# Patient Record
Sex: Female | Born: 1970 | Race: Black or African American | Hispanic: No | Marital: Single | Smoking: Never smoker
Health system: Southern US, Community
[De-identification: ages and names within clinical notes are randomized; demographics above are authoritative.]

## PROBLEM LIST (undated history)

## (undated) DIAGNOSIS — R569 Unspecified convulsions: Secondary | ICD-10-CM

## (undated) DIAGNOSIS — R7303 Prediabetes: Secondary | ICD-10-CM

## (undated) DIAGNOSIS — S069X9A Unspecified intracranial injury with loss of consciousness of unspecified duration, initial encounter: Secondary | ICD-10-CM

## (undated) DIAGNOSIS — K5090 Crohn's disease, unspecified, without complications: Secondary | ICD-10-CM

## (undated) DIAGNOSIS — S069XAA Unspecified intracranial injury with loss of consciousness status unknown, initial encounter: Secondary | ICD-10-CM

## (undated) HISTORY — PX: BACK SURGERY: SHX140

## (undated) HISTORY — PX: BOWEL RESECTION: SHX1257

## (undated) HISTORY — PX: ABDOMINAL HYSTERECTOMY: SHX81

## (undated) HISTORY — PX: ABDOMINAL SURGERY: SHX537

---

## 2017-05-13 ENCOUNTER — Emergency Department (HOSPITAL_COMMUNITY): Payer: BLUE CROSS/BLUE SHIELD

## 2017-05-13 ENCOUNTER — Emergency Department (HOSPITAL_COMMUNITY)
Admission: EM | Admit: 2017-05-13 | Discharge: 2017-05-13 | Disposition: A | Discharge status: Home / Self Care | Payer: BLUE CROSS/BLUE SHIELD | Attending: Emergency Medicine | Admitting: Emergency Medicine

## 2017-05-13 ENCOUNTER — Encounter (HOSPITAL_COMMUNITY): Payer: Self-pay | Admitting: Emergency Medicine

## 2017-05-13 DIAGNOSIS — Z79899 Other long term (current) drug therapy: Secondary | ICD-10-CM | POA: Diagnosis not present

## 2017-05-13 DIAGNOSIS — R1084 Generalized abdominal pain: Secondary | ICD-10-CM

## 2017-05-13 DIAGNOSIS — Z7951 Long term (current) use of inhaled steroids: Secondary | ICD-10-CM | POA: Diagnosis not present

## 2017-05-13 DIAGNOSIS — N390 Urinary tract infection, site not specified: Secondary | ICD-10-CM

## 2017-05-13 DIAGNOSIS — R109 Unspecified abdominal pain: Secondary | ICD-10-CM | POA: Diagnosis present

## 2017-05-13 HISTORY — DX: Unspecified intracranial injury with loss of consciousness status unknown, initial encounter: S06.9XAA

## 2017-05-13 HISTORY — DX: Unspecified intracranial injury with loss of consciousness of unspecified duration, initial encounter: S06.9X9A

## 2017-05-13 HISTORY — DX: Unspecified convulsions: R56.9

## 2017-05-13 HISTORY — DX: Prediabetes: R73.03

## 2017-05-13 HISTORY — DX: Crohn's disease, unspecified, without complications: K50.90

## 2017-05-13 LAB — URINALYSIS, ROUTINE W REFLEX MICROSCOPIC
Bilirubin Urine: NEGATIVE
Glucose, UA: NEGATIVE mg/dL
Hgb urine dipstick: NEGATIVE
Ketones, ur: NEGATIVE mg/dL
NITRITE: NEGATIVE
Protein, ur: NEGATIVE mg/dL
SPECIFIC GRAVITY, URINE: 1.025 (ref 1.005–1.030)
pH: 5 (ref 5.0–8.0)

## 2017-05-13 LAB — COMPREHENSIVE METABOLIC PANEL
ALBUMIN: 5 g/dL (ref 3.5–5.0)
ALT: 18 U/L (ref 14–54)
AST: 26 U/L (ref 15–41)
Alkaline Phosphatase: 82 U/L (ref 38–126)
Anion gap: 9 (ref 5–15)
BILIRUBIN TOTAL: 0.7 mg/dL (ref 0.3–1.2)
BUN: 13 mg/dL (ref 6–20)
CO2: 27 mmol/L (ref 22–32)
CREATININE: 1.11 mg/dL — AB (ref 0.44–1.00)
Calcium: 9.5 mg/dL (ref 8.9–10.3)
Chloride: 107 mmol/L (ref 101–111)
GFR calc Af Amer: >60 mL/min (ref >60)
GFR calc non Af Amer: 59 mL/min — ABNORMAL LOW (ref >60)
GLUCOSE: 83 mg/dL (ref 65–99)
POTASSIUM: 3 mmol/L — AB (ref 3.5–5.1)
Sodium: 143 mmol/L (ref 135–145)
TOTAL PROTEIN: 8.6 g/dL — AB (ref 6.5–8.1)

## 2017-05-13 LAB — CBC
HCT: 41.1 % (ref 36.0–46.0)
Hemoglobin: 14 g/dL (ref 12.0–15.0)
MCH: 29.8 pg (ref 26.0–34.0)
MCHC: 34.1 g/dL (ref 30.0–36.0)
MCV: 87.4 fL (ref 78.0–100.0)
PLATELETS: 403 10*3/uL — AB (ref 150–400)
RBC: 4.7 MIL/uL (ref 3.87–5.11)
RDW: 14.6 % (ref 11.5–15.5)
WBC: 7.8 10*3/uL (ref 4.0–10.5)

## 2017-05-13 LAB — LIPASE, BLOOD: Lipase: 21 U/L (ref 11–51)

## 2017-05-13 MED ORDER — FENTANYL CITRATE (PF) 100 MCG/2ML IJ SOLN
100.0000 ug | INTRAMUSCULAR | Status: DC | PRN
  Administered 2017-05-13 (×2): 100 ug via INTRAVENOUS
  Filled 2017-05-13 (×2): qty 2

## 2017-05-13 MED ORDER — OXYCODONE-ACETAMINOPHEN 5-325 MG PO TABS: 1.0000 | ORAL_TABLET | ORAL | 0 refills | Status: AC | PRN

## 2017-05-13 MED ORDER — IOPAMIDOL (ISOVUE-300) INJECTION 61%
INTRAVENOUS | Status: AC
  Filled 2017-05-13: qty 100

## 2017-05-13 MED ORDER — THIAMINE HCL 100 MG/ML IJ SOLN
Freq: Once | INTRAVENOUS | Status: AC
  Administered 2017-05-13: 06:00:00 via INTRAVENOUS
  Filled 2017-05-13: qty 1000

## 2017-05-13 MED ORDER — CEPHALEXIN 500 MG PO CAPS: 500.0000 mg | ORAL_CAPSULE | Freq: Four times a day (QID) | ORAL | 0 refills | Status: AC

## 2017-05-13 MED ORDER — SODIUM CHLORIDE 0.9 % IV BOLUS (SEPSIS)
1000.0000 mL | Freq: Once | INTRAVENOUS | Status: AC
  Administered 2017-05-13: 1000 mL via INTRAVENOUS

## 2017-05-13 MED ORDER — ONDANSETRON HCL 4 MG/2ML IJ SOLN
4.0000 mg | Freq: Once | INTRAMUSCULAR | Status: AC
  Administered 2017-05-13: 4 mg via INTRAVENOUS
  Filled 2017-05-13: qty 2

## 2017-05-13 MED ORDER — IOPAMIDOL (ISOVUE-300) INJECTION 61%
INTRAVENOUS | Status: AC
  Filled 2017-05-13: qty 30

## 2017-05-13 MED ORDER — IOPAMIDOL (ISOVUE-300) INJECTION 61%
15.0000 mL | Freq: Once | INTRAVENOUS | Status: AC | PRN
  Administered 2017-05-13: 15 mL via ORAL

## 2017-05-13 MED ORDER — IOPAMIDOL (ISOVUE-300) INJECTION 61%
100.0000 mL | Freq: Once | INTRAVENOUS | Status: AC | PRN
  Administered 2017-05-13: 100 mL via INTRAVENOUS

## 2017-05-13 MED ORDER — SODIUM CHLORIDE 0.9 % IV SOLN
INTRAVENOUS | Status: DC
  Administered 2017-05-13: 06:00:00 via INTRAVENOUS

## 2017-05-13 MED ORDER — POTASSIUM CHLORIDE CRYS ER 20 MEQ PO TBCR
40.0000 meq | EXTENDED_RELEASE_TABLET | Freq: Once | ORAL | Status: AC
  Administered 2017-05-13: 40 meq via ORAL
  Filled 2017-05-13: qty 2

## 2017-05-13 NOTE — ED Triage Notes (Signed)
Pt states she has crohn's disease and is having a flare up  Pt states she is from out of town here visiting her sister and for the past 4 days she has not been able to hold anything down  Pt states she vomits 3 times a day  Pt states she feels dehydrated and is weak  Pt states she has only voided once today  Pt states she is not able to have a BM but is passing mucous and blood from her rectum

## 2017-05-13 NOTE — Discharge Instructions (Signed)
Get plenty of rest, and drink a lot of fluids.  Follow-up with the doctor of your choice if not better in 3 or 4 days.

## 2017-05-13 NOTE — ED Provider Notes (Signed)
[facility]-EMERGENCY DEPT Provider Note   CSN: [id number] Arrival date & time: 05/13/17  0421     History   Chief Complaint Chief Complaint  Patient presents with  . Abdominal Pain    HPI Laura Pratt is a 46 y.o. female.  She presents for evaluation of abdominal pain, vomiting, fever, chills, shortness of breath, cough, paresthesias and generalized weakness.  She states that she has not had a bowel movement in several days.  Also, she is seeing blood, in the emesis, and when she attempted to have a bowel movement, passed "mucus with blood."  Symptom onset several days ago.  She feels like this is a exacerbation of her Crohn's disease.  She has been taking her usual medications, without relief.  She is visiting [place] currently.  She does not have a local medical doctor.  There are no other known modifying factors.    HPI  Past Medical History:  Diagnosis Date  . Crohn disease (HCC)   . Prediabetes   . Seizures (HCC)   . TBI (traumatic brain injury) (HCC)     There are no active problems to display for this patient.   Past Surgical History:  Procedure Laterality Date  . ABDOMINAL HYSTERECTOMY    . ABDOMINAL SURGERY    . BACK SURGERY    . BOWEL RESECTION      OB History    No data available       Home Medications    Prior to Admission medications   Medication Sig Start Date End Date Taking? Authorizing Provider  acyclovir (ZOVIRAX) 400 MG tablet Take 400 mg by mouth daily.   Yes [provider]  albuterol (VENTOLIN HFA) 108 (90 Base) MCG/ACT inhaler Inhale 2 puffs into the lungs every 4 (four) hours.   Yes [provider]  ALPRAZolam Prudy Feeler) 1 MG tablet Take 1 mg by mouth 2 (two) times daily as needed for anxiety. 05/05/17  Yes [provider]  amphetamine-dextroamphetamine (ADDERALL XR) 30 MG 24 hr capsule Take 30 mg by mouth 2 (two) times daily. 05/09/17  Yes [provider]  calcium citrate-vitamin D (CITRACAL+D) 315-200  MG-UNIT tablet Take 1 tablet by mouth daily.   Yes [provider]  cyanocobalamin (,VITAMIN B-12,) 1000 MCG/ML injection Inject 1,000 mcg into the skin every 21 ( twenty-one) days. 05/09/17  Yes [provider]  ergocalciferol (VITAMIN D2) 50000 units capsule Take 50,000 mg by mouth every 7 (seven) days.   Yes [provider]  escitalopram (LEXAPRO) 20 MG tablet Take 20 mg by mouth daily. 04/19/17  Yes [provider]  gabapentin (NEURONTIN) 300 MG capsule Take 300 mg by mouth daily. 04/22/16  Yes [provider]  hydrOXYzine (ATARAX/VISTARIL) 25 MG tablet Take 25 mg by mouth daily. 03/01/17  Yes [provider]  lamoTRIgine (LAMICTAL) 200 MG tablet Take 200 mg by mouth daily. 02/15/17  Yes [provider]  linaclotide (LINZESS) 145 MCG CAPS capsule Take 145 mcg by mouth daily.   Yes [provider]  lisinopril (PRINIVIL,ZESTRIL) 5 MG tablet Take 5 mg by mouth daily.   Yes [provider]  metFORMIN (GLUCOPHAGE-XR) 500 MG 24 hr tablet Take 500 mg by mouth 2 (two) times daily.    Yes [provider]  Multiple Vitamins-Minerals (MULTIVITAMIN ADULT PO) Take 1 tablet by mouth daily.   Yes [provider]  nitrofurantoin (MACRODANTIN) 100 MG capsule Take 100 mg by mouth every 12 (twelve) hours. 05/11/17 05/17/17 Yes [provider]  pantoprazole (PROTONIX) 40 MG tablet Take 40 mg by mouth daily.   Yes [provider]  promethazine (PHENERGAN) 25 MG tablet Take 25 mg by mouth every 12 (twelve) hours as needed for nausea/vomiting.   Yes [provider]  topiramate (TOPAMAX) 200 MG tablet Take 200 mg by mouth 2 (two) times daily. 04/24/17  Yes [provider]  ziprasidone (GEODON) 40 MG capsule Take 40 mg by mouth daily. 05/04/17  Yes [provider]  cephALEXin (KEFLEX) 500 MG capsule Take 1 capsule (500 mg total) by mouth 4 (four) times daily. 05/13/17   Mancel BaleWentz, Kaydan Wong, MD    oxyCODONE-acetaminophen (PERCOCET) 5-325 MG tablet Take 1 tablet by mouth every 4 (four) hours as needed for severe pain. 05/13/17   Mancel BaleWentz, Hargun Spurling, MD    Family History Family History  Problem Relation Age of Onset  . Hypertension Other   . Diabetes Other   . Stroke Other   . Seizures Other     Social History Social History  Substance Use Topics  . Smoking status: Never Smoker  . Smokeless tobacco: Never Used  . Alcohol use No     Allergies   Hydrocodone; Latex; and Naproxen   Review of Systems Review of Systems  All other systems reviewed and are negative.    Physical Exam Updated Vital Signs BP 120/78   Pulse 85   Temp 98.2 F (36.8 C) (Oral)   Resp 16   SpO2 100%   Physical Exam  Constitutional: She is oriented to person, place, and time. She appears well-developed and well-nourished. She appears distressed (She is uncomfortable).  HENT:  Head: Normocephalic and atraumatic.  Eyes: Conjunctivae and EOM are normal. Pupils are equal, round, and reactive to light.  Neck: Normal range of motion and phonation normal. Neck supple.  Cardiovascular: Normal rate and regular rhythm.   Pulmonary/Chest: Effort normal and breath sounds normal. She exhibits no tenderness.  Abdominal: Soft. She exhibits no distension and no mass. There is tenderness (Diffuse, moderate.). There is no rebound and no guarding.  Musculoskeletal: Normal range of motion.  Neurological: She is alert and oriented to person, place, and time. She exhibits normal muscle tone.  Skin: Skin is warm and dry.  Psychiatric: She has a normal mood and affect. Her behavior is normal. Judgment and thought content normal.  Nursing note and vitals reviewed.    ED Treatments / Results  Labs (all labs ordered are listed, but only abnormal results are displayed) Labs Reviewed  COMPREHENSIVE METABOLIC PANEL - Abnormal; Notable for the following:       Result Value   Potassium 3.0 (*)    Creatinine, Ser 1.11  (*)    Total Protein 8.6 (*)    GFR calc non Af Amer 59 (*)    All other components within normal limits  CBC - Abnormal; Notable for the following:    Platelets 403 (*)    All other components within normal limits  URINALYSIS, ROUTINE W REFLEX MICROSCOPIC - Abnormal; Notable for the following:    Color, Urine AMBER (*)    APPearance HAZY (*)    Leukocytes, UA MODERATE (*)    Bacteria, UA RARE (*)    Squamous Epithelial / LPF 0-5 (*)    All other components within normal limits  URINE CULTURE  LIPASE, BLOOD    EKG  EKG Interpretation None       Radiology Ct Abdomen Pelvis W Contrast  Result Date: 05/13/2017 CLINICAL DATA:  46 year old female with  abdominal and pelvic pain with vomiting for 4 days. History of Crohn's disease. EXAM: CT ABDOMEN AND PELVIS WITH CONTRAST TECHNIQUE: Multidetector CT imaging of the abdomen and pelvis was performed using the standard protocol following bolus administration of intravenous contrast. CONTRAST:  ISOVUE-300 IOPAMIDOL (ISOVUE-300) INJECTION 61% COMPARISON:  None. FINDINGS: Lower chest: No acute abnormality Hepatobiliary: The liver and gallbladder are unremarkable. There is no evidence of biliary dilatation. Pancreas: Unremarkable Spleen: Unremarkable Adrenals/Urinary Tract: A punctate nonobstructing left lower pole renal calculus and small mid right renal cyst are present. The kidneys, adrenal glands and bladder are otherwise unremarkable. Stomach/Bowel: Surgical changes along the stomach noted. There is no evidence of bowel obstruction, bowel wall thickening or inflammatory changes. The appendix is normal. Vascular/Lymphatic: Aortic atherosclerotic calcifications noted without aneurysm. No enlarged lymph nodes. Reproductive: Status post hysterectomy. No adnexal masses. Other: A trace amount of free pelvic fluid may be physiologic. No focal collection or pneumoperitoneum. Musculoskeletal: No acute or suspicious abnormality. Posterior fusion in the  lower lumbar spine noted. IMPRESSION: Trace amount of free fluid which may be physiologic. No other acute abnormalities noted. No evidence of bowel obstruction, bowel wall thickening or inflammatory bowel changes. Punctate nonobstructing left renal calculus. Abdominal aortic atherosclerosis. Electronically Signed   By: Harmon Pier M.D.   On: 05/13/2017 08:06    Procedures Procedures (including critical care time)  Medications Ordered in ED Medications  0.9 %  sodium chloride infusion ( Intravenous New Bag/Given 05/13/17 0622)  fentaNYL (SUBLIMAZE) injection 100 mcg (100 mcg Intravenous Given 05/13/17 0627)  iopamidol (ISOVUE-300) 61 % injection (not administered)  iopamidol (ISOVUE-300) 61 % injection (not administered)  sodium chloride 0.9 % bolus 1,000 mL (0 mLs Intravenous Stopped 05/13/17 0845)  ondansetron (ZOFRAN) injection 4 mg (4 mg Intravenous Given 05/13/17 0622)  sodium chloride 0.9 % 1,000 mL with thiamine 100 mg, folic acid 1 mg, multivitamins adult 10 mL infusion ( Intravenous New Bag/Given 05/13/17 0622)  iopamidol (ISOVUE-300) 61 % injection 15 mL (15 mLs Oral Contrast Given 05/13/17 0628)  iopamidol (ISOVUE-300) 61 % injection 100 mL (100 mLs Intravenous Contrast Given 05/13/17 0730)  potassium chloride SA (K-DUR,KLOR-CON) CR tablet 40 mEq (40 mEq Oral Given 05/13/17 0848)     Initial Impression / Assessment and Plan / ED Course  I have reviewed the triage vital signs and the nursing notes.  Pertinent labs & imaging results that were available during my care of the patient were reviewed by me and considered in my medical decision making (see chart for details).      Patient Vitals for the past 24 hrs:  BP Temp Temp src Pulse Resp SpO2  05/13/17 0850 120/78 - - 85 16 100 %  05/13/17 0611 126/82 - - 83 18 96 %  05/13/17 0439 135/89 98.2 F (36.8 C) Oral 89 16 100 %    9:14 AM Reevaluation with update and discussion. After initial assessment and treatment, an updated evaluation  reveals patient appears comfortable.  Findings discussed with the patient.  To complete the rest of her "banana bag.",  Prior to leaving.  All questions answered.Mancel Bale L    Final Clinical Impressions(s) / ED Diagnoses   Final diagnoses:  Generalized abdominal pain  Urinary tract infection without hematuria, site unspecified    Nonspecific abdominal pain with reported Crohn's disease.  The patient's CT does not indicate inflammatory bowel disease or any other acute process.  Possible urinary tract infection, culture ordered.  There is no indication for hospitalization at this  time.  I doubt serious bacterial infection, metabolic instability or impending vascular collapse.  Nursing Notes Reviewed/ Care Coordinated Applicable Imaging Reviewed Interpretation of Laboratory Data incorporated into ED treatment  The patient appears reasonably screened and/or stabilized for discharge and I doubt any other medical condition or other [facility] requiring further screening, evaluation, or treatment in the ED at this time prior to discharge.  Plan: Home Medications-continue usual home medications, except Macrodantin; Home Treatments-rest, gradually advance diet; return here if the recommended treatment, does not improve the symptoms; Recommended follow up-PCP, as needed  New Prescriptions New Prescriptions   CEPHALEXIN (KEFLEX) 500 MG CAPSULE    Take 1 capsule (500 mg total) by mouth 4 (four) times daily.   OXYCODONE-ACETAMINOPHEN (PERCOCET) 5-325 MG TABLET    Take 1 tablet by mouth every 4 (four) hours as needed for severe pain.     Mancel Bale, MD 05/13/17 [phone]

## 2017-05-14 LAB — URINE CULTURE: Culture: NO GROWTH

## 2018-10-22 IMAGING — CT CT ABD-PELV W/ CM
2 of 5 series · 16 of 46 positions shown, 18 images · IV contrast (iopamidol)
Comparison: None.

CLINICAL DATA: 46-year-old female with abdominal and pelvic pain
with vomiting for 4 days. History of Crohn's disease.

EXAM:
CT ABDOMEN AND PELVIS WITH CONTRAST
TECHNIQUE: Multidetector CT imaging of the abdomen and pelvis was performed
using the standard protocol following bolus administration of
intravenous contrast.
CONTRAST:  100mL [id number] IOPAMIDOL ([id number]-[id number]) INJECTION 61%

[Series 2: abd/pel with · axial · 0.74mm/px · z∈[-491,-81]mm · 13 of 94 slices shown, 15 images]
[im 6/94  soft-tissue]
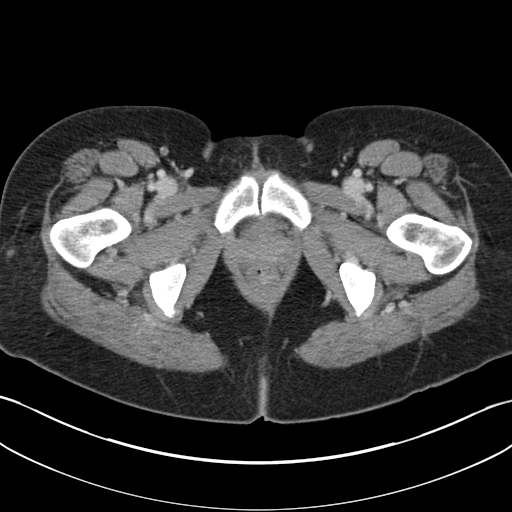
[im 6/94  bone]
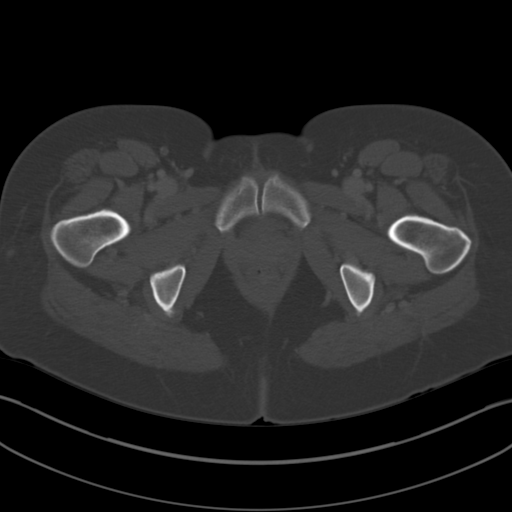
[im 12/94  soft-tissue]
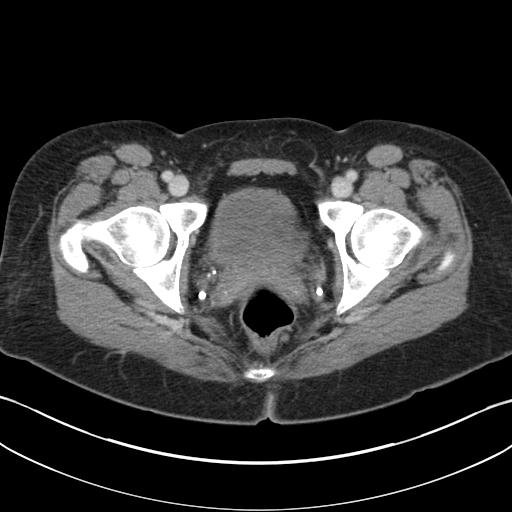
[im 18/94  soft-tissue]
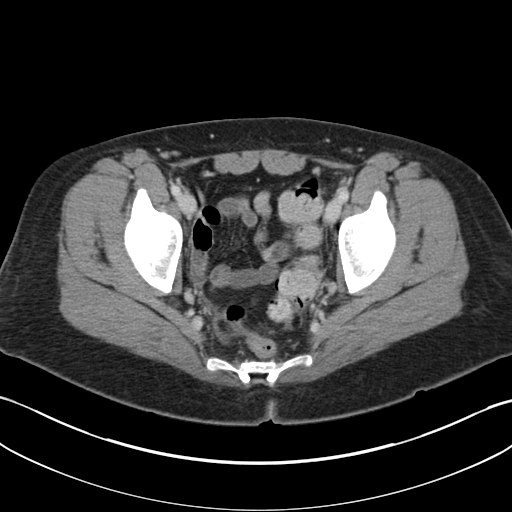
[im 30/94  soft-tissue]
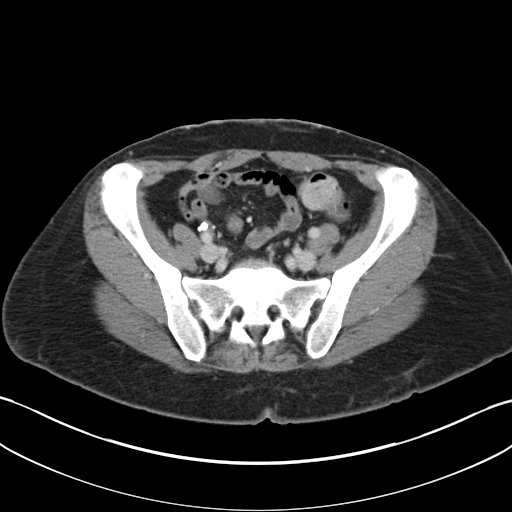
[im 35/94  soft-tissue]
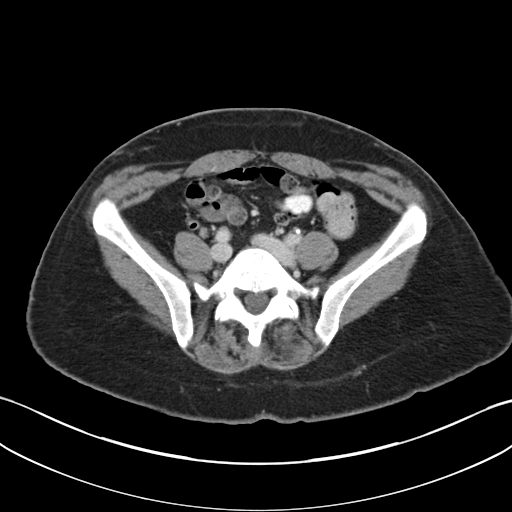
[im 41/94  soft-tissue]
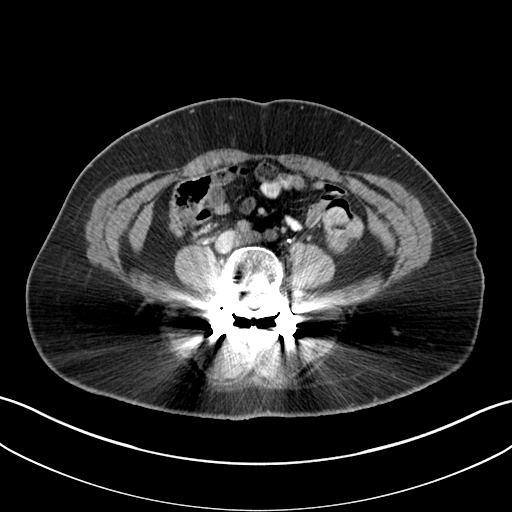
[im 47/94  soft-tissue]
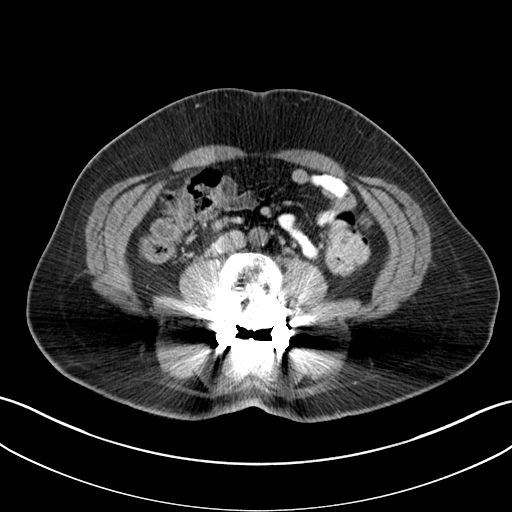
[im 53/94  soft-tissue]
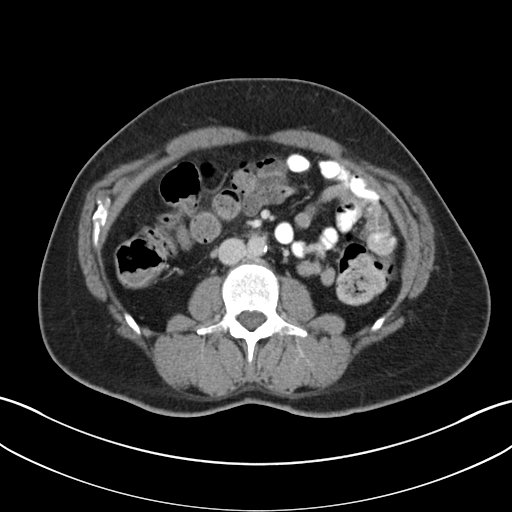
[im 59/94  soft-tissue]
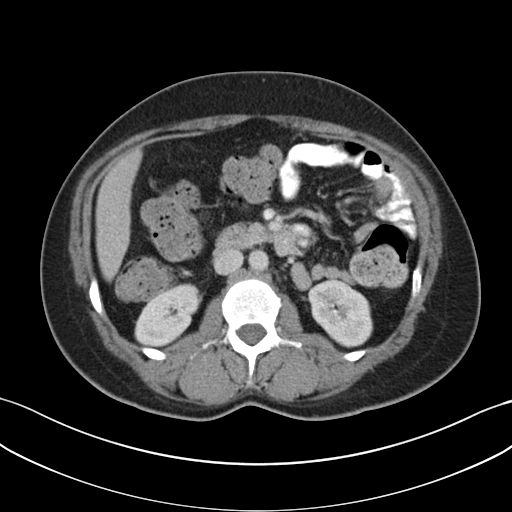
[im 59/94  bone]
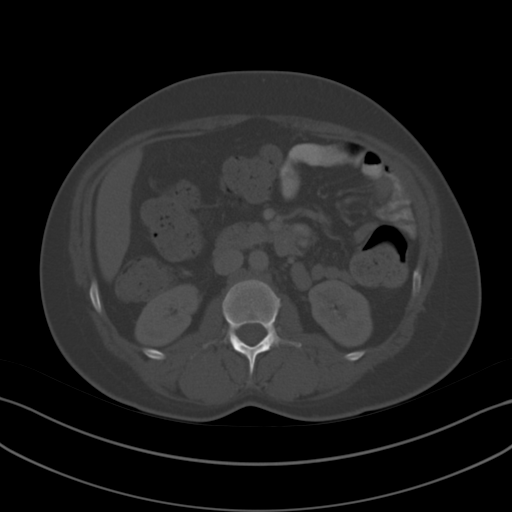
[im 64/94  soft-tissue]
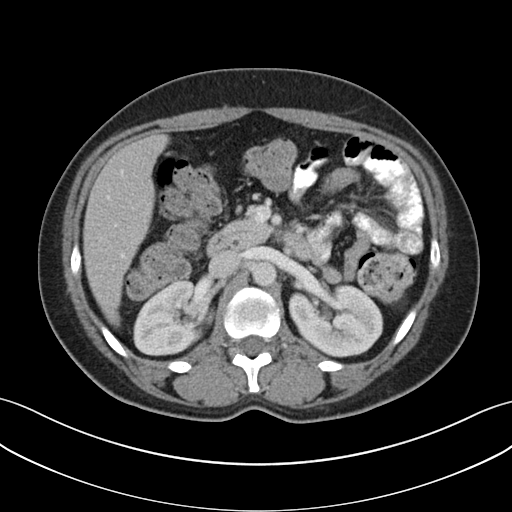
[im 76/94  soft-tissue]
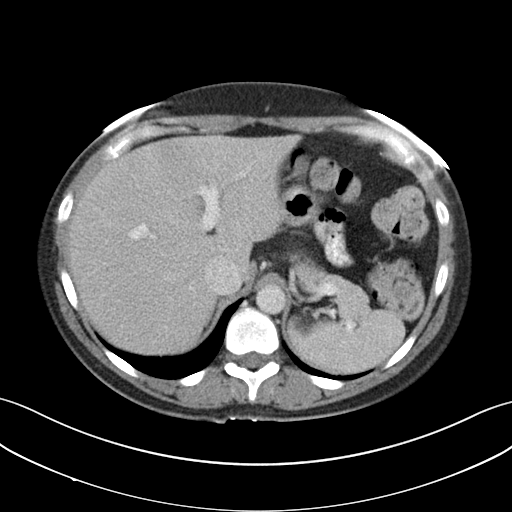
[im 82/94  soft-tissue]
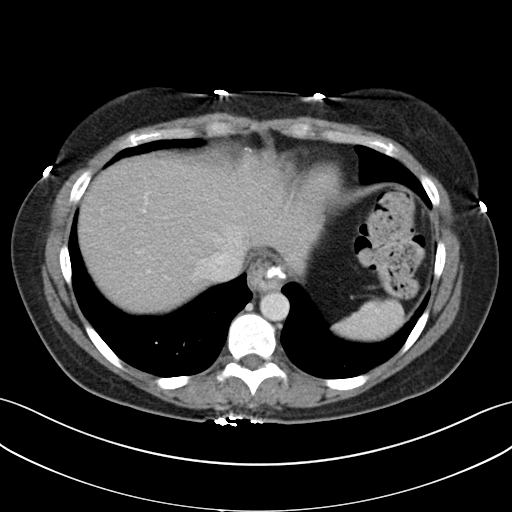
[im 88/94  soft-tissue]
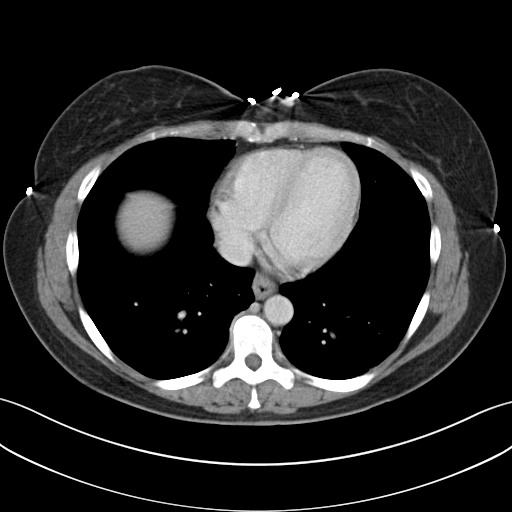

[Series 5: coronal a/|p · coronal · 0.77mm/px · 3 of 121 slices shown]
[im 41/121  soft-tissue]
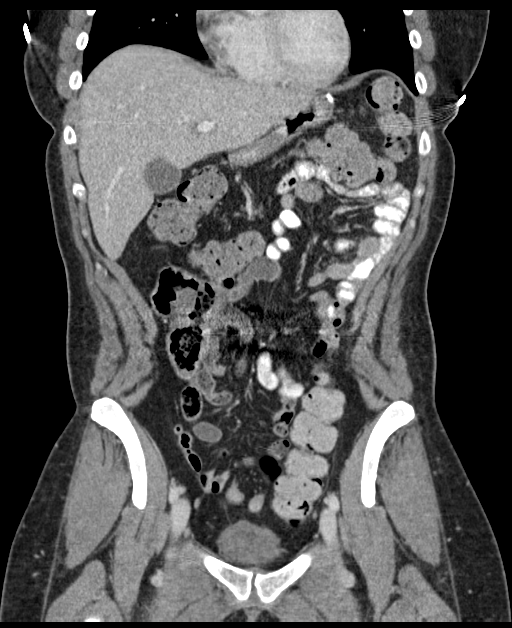
[im 54/121  soft-tissue]
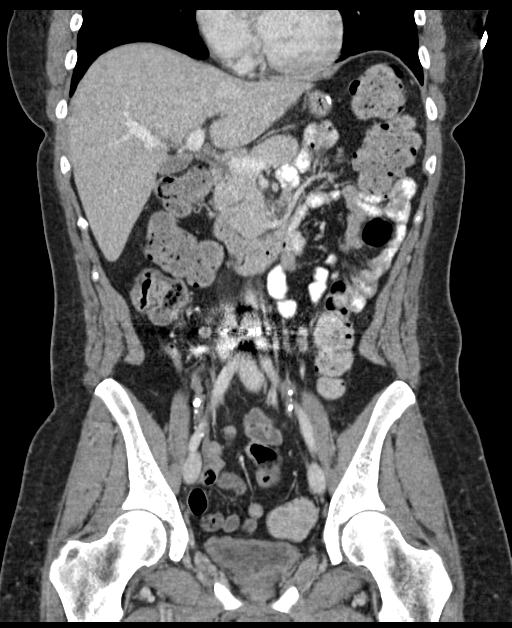
[im 67/121  soft-tissue]
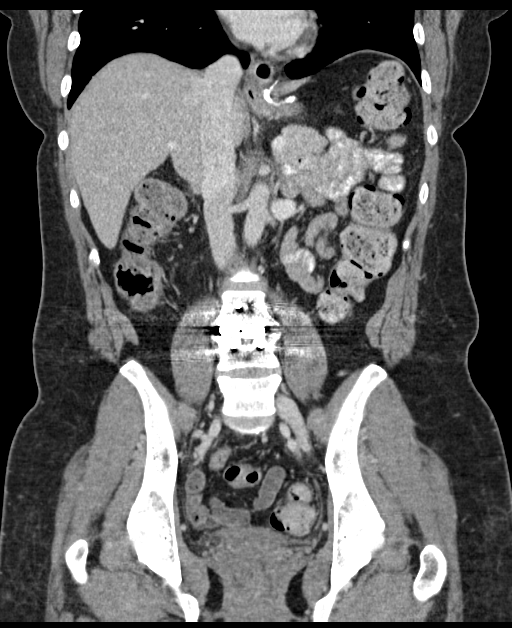

[16 of 46 positions shown; findings below may reference images not displayed]

FINDINGS: Lower chest: No acute abnormality

Hepatobiliary: The liver and gallbladder are unremarkable. There is
no evidence of biliary dilatation.

Pancreas: Unremarkable

Spleen: Unremarkable

Adrenals/Urinary Tract: A punctate nonobstructing left lower pole
renal calculus and small mid right renal cyst are present. The
kidneys, adrenal glands and bladder are otherwise unremarkable.

Stomach/Bowel: Surgical changes along the stomach noted. There is no
evidence of bowel obstruction, bowel wall thickening or inflammatory
changes. The appendix is normal.

Vascular/Lymphatic: Aortic atherosclerotic calcifications noted
without aneurysm. No enlarged lymph nodes.

Reproductive: Status post hysterectomy. No adnexal masses.

Other: A trace amount of free pelvic fluid may be physiologic. No
focal collection or pneumoperitoneum.

Musculoskeletal: No acute or suspicious abnormality. Posterior
fusion in the lower lumbar spine noted.
IMPRESSION: Trace amount of free fluid which may be physiologic. No other acute
abnormalities noted. No evidence of bowel obstruction, bowel wall
thickening or inflammatory bowel changes.

Punctate nonobstructing left renal calculus.

Abdominal aortic atherosclerosis.
